# Patient Record
Sex: Female | Born: 1980 | Race: White | Hispanic: No | Marital: Married | Smoking: Never smoker
Health system: Southern US, Community
[De-identification: ages and names within clinical notes are randomized; demographics above are authoritative.]

## PROBLEM LIST (undated history)

## (undated) DIAGNOSIS — R251 Tremor, unspecified: Secondary | ICD-10-CM

## (undated) HISTORY — DX: Tremor, unspecified: R25.1

## (undated) HISTORY — PX: WISDOM TOOTH EXTRACTION: SHX21

---

## 2009-03-01 ENCOUNTER — Ambulatory Visit (HOSPITAL_COMMUNITY): Admission: RE | Admit: 2009-03-01 | Discharge: 2009-03-01 | Payer: Self-pay | Admitting: Specialist

## 2009-03-22 ENCOUNTER — Ambulatory Visit (HOSPITAL_COMMUNITY): Admission: RE | Admit: 2009-03-22 | Discharge: 2009-03-22 | Payer: Self-pay | Admitting: Specialist

## 2009-04-19 ENCOUNTER — Ambulatory Visit (HOSPITAL_COMMUNITY): Admission: RE | Admit: 2009-04-19 | Discharge: 2009-04-19 | Payer: Self-pay | Admitting: Specialist

## 2010-05-06 IMAGING — US US OB FOLLOW-UP
1 series · 18 of 28 positions shown · non-contrast
Comparison: none

OBSTETRICAL ULTRASOUND:
 This ultrasound was performed in The [HOSPITAL], and the [facility] report will be stored to [REDACTED] PACS.

[Series 1: us ob follow-up · 18 of 49 slices shown]
[im 1/49]
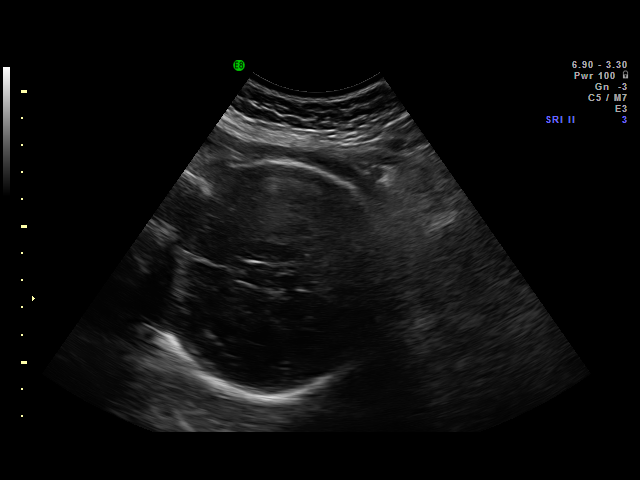
[im 4/49]
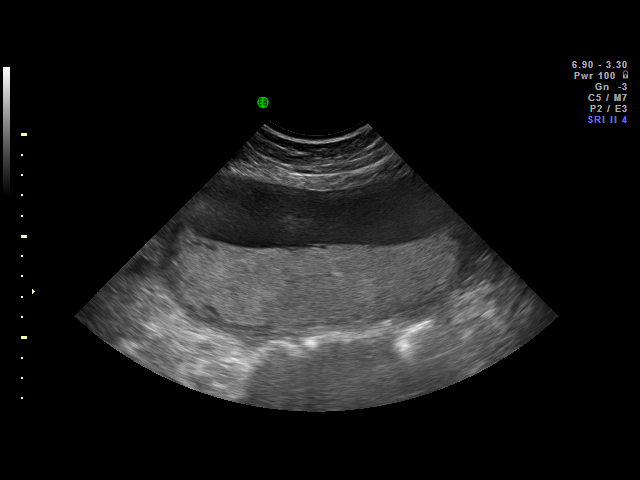
[im 6/49]
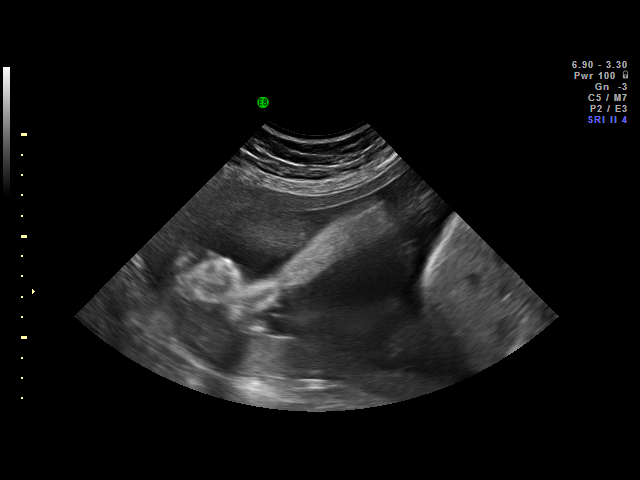
[im 9/49]
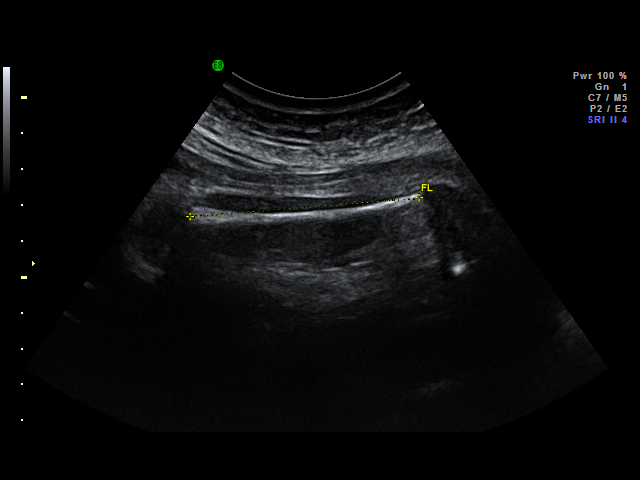
[im 13/49]
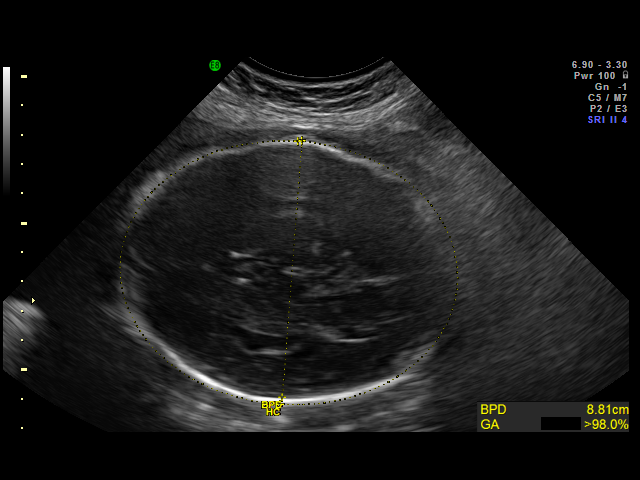
[im 15/49]
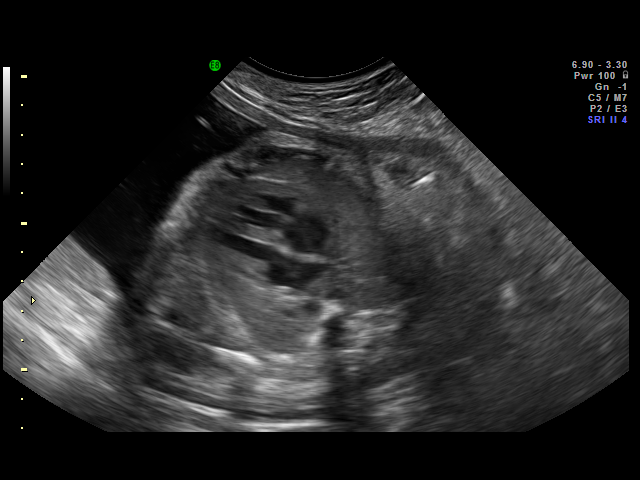
[im 18/49]
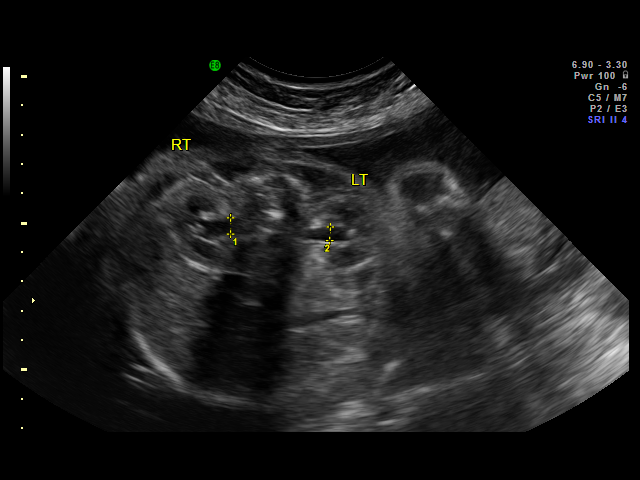
[im 20/49]
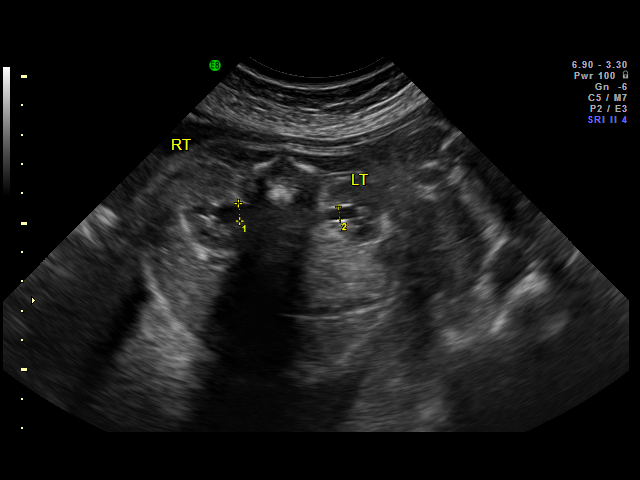
[im 24/49]
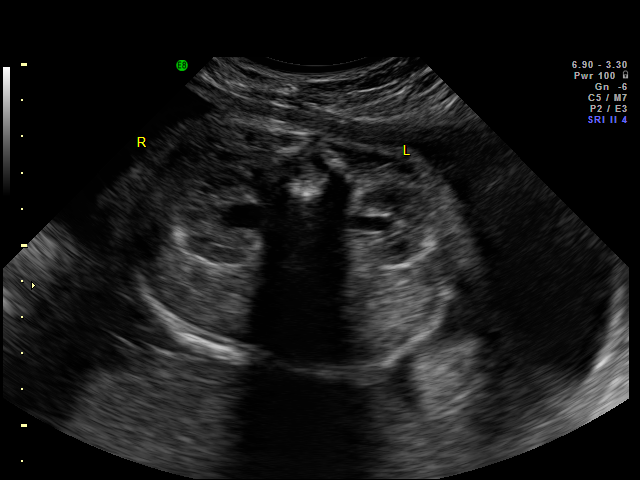
[im 25/49]
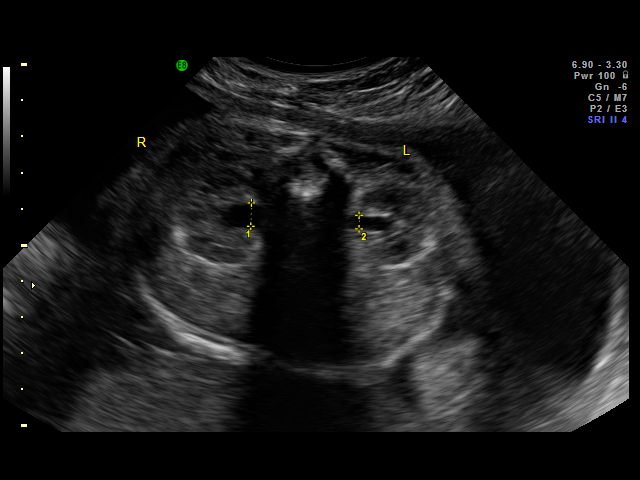
[im 29/49]
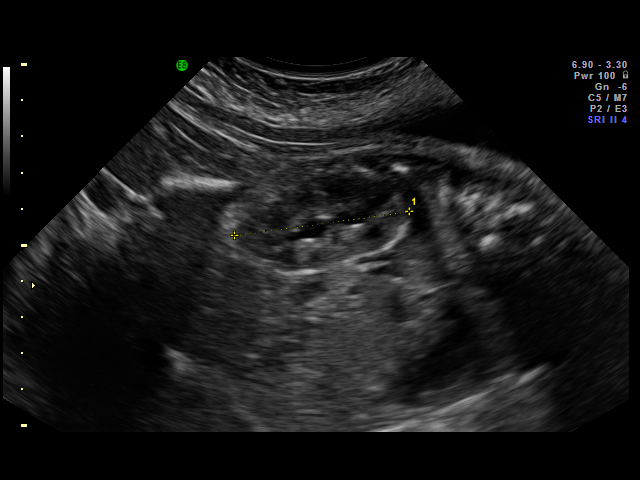
[im 31/49]
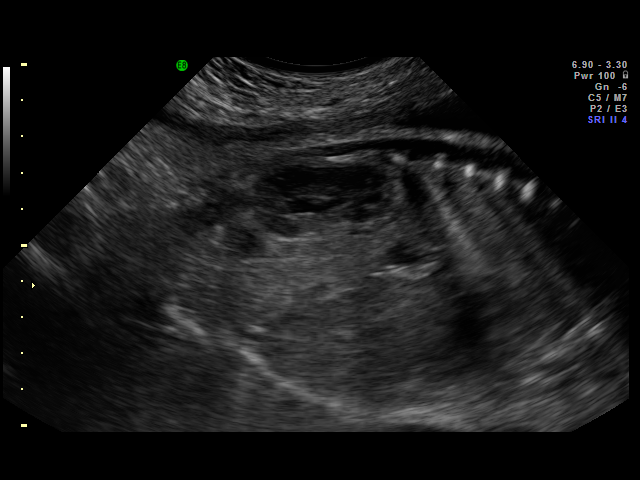
[im 34/49]
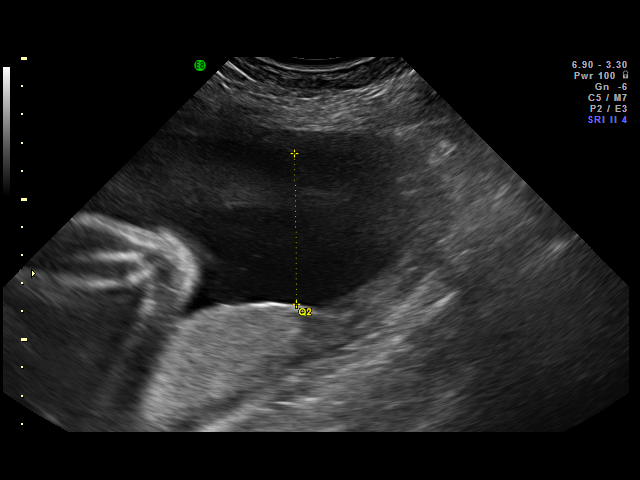
[im 38/49]
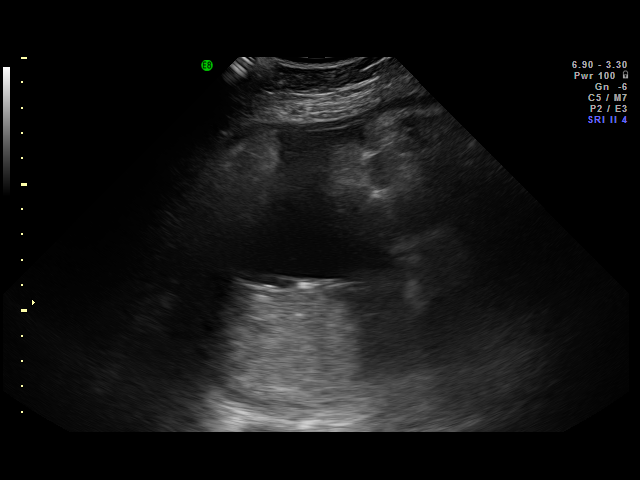
[im 40/49]
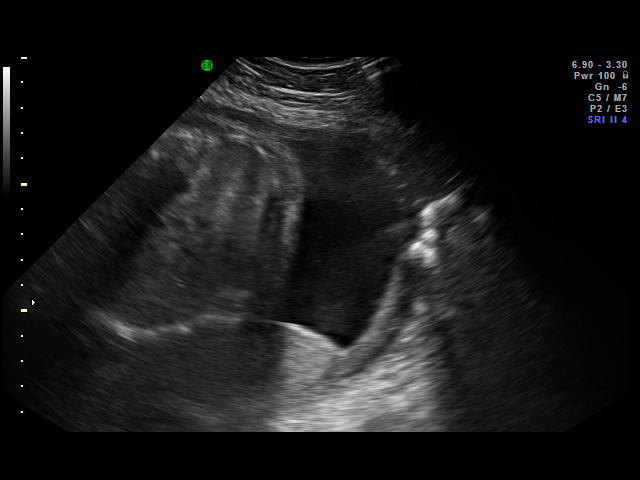
[im 43/49]
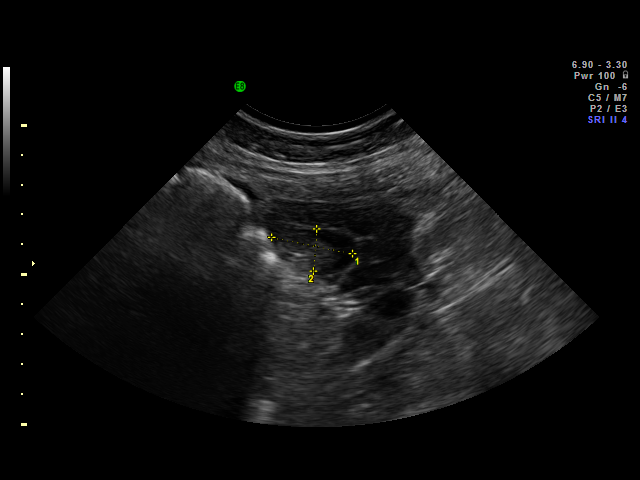
[im 45/49]
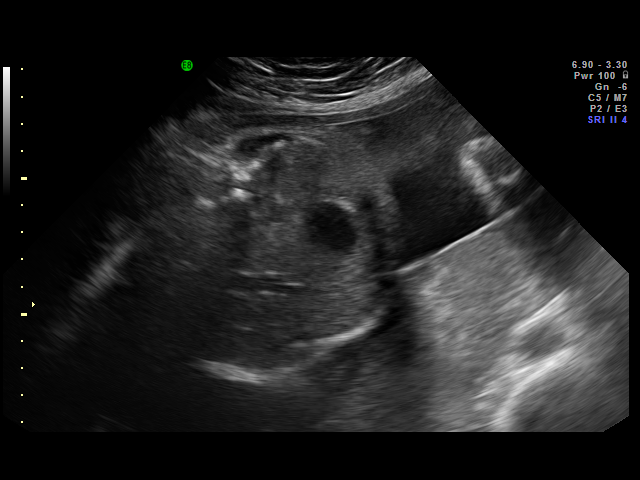
[im 49/49]
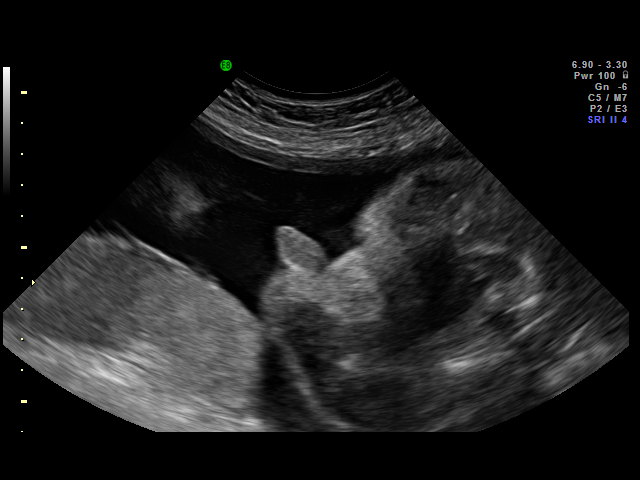

[18 of 28 positions shown; findings below may reference images not displayed]

IMPRESSION: [facility] has also been faxed to the ordering physician.

## 2015-06-13 ENCOUNTER — Encounter: Payer: Self-pay | Admitting: Neurology

## 2015-06-13 ENCOUNTER — Ambulatory Visit (INDEPENDENT_AMBULATORY_CARE_PROVIDER_SITE_OTHER): Payer: BLUE CROSS/BLUE SHIELD | Admitting: Neurology

## 2015-06-13 VITALS — BP 113/74 | HR 85 | Ht 67.5 in | Wt 215.0 lb

## 2015-06-13 DIAGNOSIS — G250 Essential tremor: Secondary | ICD-10-CM | POA: Diagnosis not present

## 2015-06-13 NOTE — Progress Notes (Signed)
PATIENT: Brittney Scott DOB: 1981/04/29  Chief Complaint  Patient presents with  . Tremors    Reports having tremors in her left hand for the last year.  She notices it worsens with stress.     HISTORICAL  Brittney Scott is a 34 years old right-handed female, seen in refer by her primary care physician Dr. Carlena Bjornstad Hungarland in June 13 2015 for evaluation of left hand tremor  I reviewed and summarized her office note in May 27 2015, she was previously healthy, had 2 C-sections in the past, laboratory evaluation in May 27 2015 showed normal CBC, CMP, TSH  She presented with left hand tremor since 2015, gradually getting worse, become more noticeable since October 2016, when she hold object with her left hand, has more noticeable left hand shaking, spilled drink out of the cup, she denies weakness, she has occasionally right hand shaking,  She denies family history of hand shaking, she denies gait difficulty, no sensory loss, no visual loss. She denies sleeping difficulties.  REVIEW OF SYSTEMS: Full 14 system review of systems performed and notable only for tremor ALLERGIES: No Known Allergies  HOME MEDICATIONS: No current outpatient prescriptions on file.   No current facility-administered medications for this visit.    PAST MEDICAL HISTORY: Past Medical History  Diagnosis Date  . Tremor     Left Hand    PAST SURGICAL HISTORY: Past Surgical History  Procedure Laterality Date  . Cesarean section  2008, 2010  . Wisdom tooth extraction      FAMILY HISTORY: Family History  Problem Relation Age of Onset  . Hypertension Father   . Healthy Mother   . Ovarian cancer Maternal Grandmother   . Lung cancer Maternal Grandfather     SOCIAL HISTORY:  Social History   Social History  . Marital Status: Married    Spouse Name: N/A  . Number of Children: 2  . Years of Education: 16   Occupational History  . Homemaker    Social History Main Topics  .  Smoking status: Never Smoker   . Smokeless tobacco: Not on file  . Alcohol Use: 0.0 oz/week    0 Standard drinks or equivalent per week     Comment: Very rarely  . Drug Use: No  . Sexual Activity: Not on file   Other Topics Concern  . Not on file   Social History Narrative   Lives at home with her husband and 2 children.   Right-handed.   Cup of coffee daily.  Occasional coke.     PHYSICAL EXAM   Filed Vitals:   06/13/15 0954  BP: 113/74  Pulse: 85  Height: 5' 7.5" (1.715 m)  Weight: 215 lb (97.523 kg)    Not recorded      Body mass index is 33.16 kg/(m^2).  PHYSICAL EXAMNIATION:  Gen: NAD, conversant, well nourised, obese, well groomed                     Cardiovascular: Regular rate rhythm, no peripheral edema, warm, nontender. Eyes: Conjunctivae clear without exudates or hemorrhage Neck: Supple, no carotid bruise. Pulmonary: Clear to auscultation bilaterally   NEUROLOGICAL EXAM:  MENTAL STATUS: Speech:    Speech is normal; fluent and spontaneous with normal comprehension.  Cognition:     Orientation to time, place and person     Normal recent and remote memory     Normal Attention span and concentration     Normal Language, naming,  repeating,spontaneous speech     Fund of knowledge   CRANIAL NERVES: CN II: Visual fields are full to confrontation. Fundoscopic exam is normal with sharp discs and no vascular changes. Pupils are round equal and briskly reactive to light. CN III, IV, VI: extraocular movement are normal. No ptosis. CN V: Facial sensation is intact to pinprick in all 3 divisions bilaterally. Corneal responses are intact.  CN VII: Face is symmetric with normal eye closure and smile. CN VIII: Hearing is normal to rubbing fingers CN IX, X: Palate elevates symmetrically. Phonation is normal. CN XI: Head turning and shoulder shrug are intact CN XII: Tongue is midline with normal movements and no atrophy.  MOTOR: Muscle bulk and tone are normal.  Muscle strength is normal. She has mild bilateral hand postural tremor, left worse than right  REFLEXES: Reflexes are 2+ and symmetric at the biceps, triceps, knees, and ankles. Plantar responses are flexor.  SENSORY: Intact to light touch, pinprick, position sense, and vibration sense are intact in fingers and toes.  COORDINATION: Rapid alternating movements and fine finger movements are intact. There is no dysmetria on finger-to-nose and heel-knee-shin.    GAIT/STANCE: Posture is normal. Gait is steady with normal steps, base, arm swing, and turning. Heel and toe walking are normal. Tandem gait is normal.  Romberg is absent.   DIAGNOSTIC DATA (LABS, IMAGING, TESTING) - I reviewed patient records, labs, notes, testing and imaging myself where available.   ASSESSMENT AND PLAN  Brittney Scott is a 34 y.o. female    Bilateral hand postural tremor, left worse than right,  Most consistent with essential tremor  Continue to observe her symptoms, may consider beta blocker as needed if she needs symptomatic control    Levert FeinsteinYijun Anthonio Mizzell, M.D. Ph.D.  [facility] [address], [address] [place], [place] [address] Ph: [phone] Fax: [phone]  CC: Maximiano CossJohn David Hungarland, MD
